# Patient Record
Sex: Female | Born: 1985 | Race: White | Hispanic: No | Marital: Married
Health system: Southern US, Community
[De-identification: ages and names within clinical notes are randomized; demographics above are authoritative.]

---

## 2017-03-26 ENCOUNTER — Other Ambulatory Visit: Payer: Self-pay | Admitting: Family Medicine

## 2017-03-26 ENCOUNTER — Ambulatory Visit
Admission: RE | Admit: 2017-03-26 | Discharge: 2017-03-26 | Disposition: A | Discharge status: Home / Self Care | Payer: Managed Care, Other (non HMO) | Source: Ambulatory Visit | Attending: Family Medicine | Admitting: Family Medicine

## 2017-03-26 DIAGNOSIS — T1490XA Injury, unspecified, initial encounter: Secondary | ICD-10-CM

## 2018-03-08 ENCOUNTER — Emergency Department (HOSPITAL_COMMUNITY): Payer: Managed Care, Other (non HMO)

## 2018-03-08 ENCOUNTER — Emergency Department (HOSPITAL_COMMUNITY)
Admission: EM | Admit: 2018-03-08 | Discharge: 2018-03-08 | Disposition: A | Discharge status: Home / Self Care | Payer: Managed Care, Other (non HMO) | Attending: Emergency Medicine | Admitting: Emergency Medicine

## 2018-03-08 ENCOUNTER — Encounter (HOSPITAL_COMMUNITY): Payer: Self-pay | Admitting: Emergency Medicine

## 2018-03-08 DIAGNOSIS — S99911A Unspecified injury of right ankle, initial encounter: Secondary | ICD-10-CM | POA: Diagnosis not present

## 2018-03-08 DIAGNOSIS — Y999 Unspecified external cause status: Secondary | ICD-10-CM | POA: Diagnosis not present

## 2018-03-08 DIAGNOSIS — W102XXA Fall (on)(from) incline, initial encounter: Secondary | ICD-10-CM | POA: Insufficient documentation

## 2018-03-08 DIAGNOSIS — Y9301 Activity, walking, marching and hiking: Secondary | ICD-10-CM | POA: Insufficient documentation

## 2018-03-08 DIAGNOSIS — Y929 Unspecified place or not applicable: Secondary | ICD-10-CM | POA: Insufficient documentation

## 2018-03-08 MED ORDER — IBUPROFEN 800 MG PO TABS
800.0000 mg | ORAL_TABLET | Freq: Once | ORAL | Status: AC
  Administered 2018-03-08: 800 mg via ORAL
  Filled 2018-03-08: qty 1

## 2018-03-08 NOTE — Discharge Instructions (Addendum)
Please read and follow all provided instructions.  You have been seen today for an injury to your Right ankle.   Tests performed today include: An x-ray of the affected area - does NOT show any broken bones or dislocations.  Vital signs. See below for your results today.   Home care instructions: -- *PRICE in the first 24-48 hours after injury: Protect (with brace, splint, sling), if given by your provider Rest Ice- Do not apply ice pack directly to your skin, place towel or similar between your skin and ice/ice pack. Apply ice for 20 min, then remove for 40 min while awake Compression- Wear brace, elastic bandage, splint as directed by your provider Elevate affected extremity above the level of your heart when not walking around for the first 24-48 hours   Use Ibuprofen (Motrin/Advil) 600mg  every 6 hours as needed for pain (do not exceed max dose in 24 hours, 2400mg )  Follow-up instructions: Please follow-up with your primary care provider or the provided orthopedic physician (bone specialist) if you continue to have significant pain in 1 week. In this case you may have a more severe injury that requires further care.   Return instructions:  Please return if your toes or feet are numb, appear gray or blue, or you have severe pain (also elevate the leg and loosen splint or wrap if you were given one) Please return to the Emergency Department if you experience worsening symptoms.  Please return if you have any other emergent concerns. Additional Information:  Your vital signs today were: BP 119/66 (BP Location: Right Arm)    Pulse 63    Temp 97.9 F (36.6 C)    Resp 16    LMP 02/15/2018    SpO2 100%  If your blood pressure (BP) was elevated above 135/85 this visit, please have this repeated by your doctor within one month. ---------------

## 2018-03-08 NOTE — ED Provider Notes (Signed)
[facility] EMERGENCY DEPARTMENT Provider Note   CSN: [id number] Arrival date & time: 03/08/18  1401     History   Chief Complaint Chief Complaint  Patient presents with  . Ankle Pain    HPI Alicia Ingram is a 32 y.o. female without significant past medical hx who presents to the ED with R ankle injury which occurred at 11:30 this AM. Patient states she was ambulating, missed a step, and rolled the R ankle falling to the ground. Describes inversion type injury. She did fall to the ground, no head injury, no LOC, no other areas of injury. She has been able to bear weight since the incident but this increases pain. She has applied ice. No other specific alleviating/aggravating factors. Pain severity is moderate. She has sprained this ankle in the past. Has not had prior surgery on the ankle. Denies numbness, weakness, paresthesias, or any other areas of injury.   HPI  History reviewed. No pertinent past medical history.  There are no active problems to display for this patient.   History reviewed. No pertinent surgical history.   OB History   None      Home Medications    Prior to Admission medications   Not on File    Family History History reviewed. No pertinent family history.  Social History Social History   Tobacco Use  . Smoking status: Not on file  Substance Use Topics  . Alcohol use: Not on file  . Drug use: Not on file     Allergies   Patient has no known allergies.   Review of Systems Review of Systems  Constitutional: Negative for chills and fever.  Musculoskeletal: Positive for arthralgias (R ankle pain).  Neurological: Negative for weakness and numbness.    Physical Exam Updated Vital Signs BP 119/66 (BP Location: Right Arm)   Pulse 63   Temp 97.9 F (36.6 C)   Resp 16   LMP 02/15/2018   SpO2 100%   Physical Exam  Constitutional: She appears well-developed and well-nourished. No distress.  HENT:  Head:  Normocephalic and atraumatic. Head is without raccoon's eyes and without Battle's sign.  Eyes: Conjunctivae are normal. Right eye exhibits no discharge. Left eye exhibits no discharge.  Neck: Normal range of motion. Neck supple. No spinous process tenderness present.  Cardiovascular: Normal rate and regular rhythm.  Pulses:      Dorsalis pedis pulses are 2+ on the right side, and 2+ on the left side.       Posterior tibial pulses are 2+ on the right side, and 2+ on the left side.  Pulmonary/Chest: Effort normal and breath sounds normal. No respiratory distress.  Abdominal: Soft. There is no tenderness.  Musculoskeletal:  Patient with mild diffuse swelling to the right ankle.  Otherwise no appreciable swelling, no obvious deformities, no erythema, no ecchymosis, no open wounds. Back: No midline tenderness. Upper extremities: Normal range of motion.  Nontender. Lower extremities: Patient has normal range of motion to bilateral hips, knees, and ankles.  She is somewhat tender to palpation over the lateral ankle ligaments as well as to superior to the medial malleolus on the R side.  No malleolar tenderness.  No tenderness at the base of the fifth.  No tenderness to the navicular bone.  No tenderness in the fibular head.  Lower extremities are otherwise nontender.  NVI distally.  Neurological: She is alert.  Clear speech.  Sensation grossly intact bilateral lower extremities.  Patient has 5 out of  5 strength with plantar dorsiflexion bilaterally.  She is able to ambulate somewhat antalgic gait.  Psychiatric: She has a normal mood and affect. Her behavior is normal. Thought content normal.  Nursing note and vitals reviewed.  ED Treatments / Results  Labs (all labs ordered are listed, but only abnormal results are displayed) Labs Reviewed - No data to display  EKG None  Radiology Dg Ankle Complete Right  Result Date: 03/08/2018 CLINICAL DATA:  Twisting injury right ankle today going down  stairs. Initial encounter. EXAM: RIGHT ANKLE - COMPLETE 3+ VIEW COMPARISON:  None. FINDINGS: There is no evidence of fracture, dislocation, or joint effusion. There is no evidence of arthropathy or other focal bone abnormality. Soft tissues are unremarkable. IMPRESSION: Normal exam. Electronically Signed   By: Drusilla Kannerhomas  Dalessio M.D.   On: 03/08/2018 14:30    Procedures Procedures (including critical care time)  Medications Ordered in ED Medications  ibuprofen (ADVIL,MOTRIN) tablet 800 mg (has no administration in time range)   Initial Impression / Assessment and Plan / ED Course  I have reviewed the triage vital signs and the nursing notes.  Pertinent labs & imaging results that were available during my care of the patient were reviewed by me and considered in my medical decision making (see chart for details).   Patient presents to the ED s/p mechanical fall with R ankle injury. Patient nontoxic appearing, in no apparent distress, initial vitals WNL. No evidence of serious head/neck/back injury from fall. R ankle X-ray negative for fracture/dislocation. Patient NVI distally. Will place in ASO brace, recommended ibuprofen for pain/swelling, PRICE, and orthopedics follow up. I discussed results, treatment plan, need for follow-up, and return precautions with the patient. Provided opportunity for questions, patient confirmed understanding and is in agreement with plan.   Final Clinical Impressions(s) / ED Diagnoses   Final diagnoses:  Injury of right ankle, initial encounter    ED Discharge Orders    None       Cherly Andersonetrucelli, [place] Sanon R, PA-C 03/08/18 1602    Doug SouJacubowitz, Sam, MD 03/08/18 [phone]

## 2018-03-08 NOTE — ED Triage Notes (Signed)
Patient reports she missed a step while walking down an incline and rolled her right ankle. Patient limping, declined wheelchair. Complains of pain in right ankle, no other complaints at this time.

## 2018-06-30 IMAGING — DX DG ANKLE COMPLETE 3+V*R*
3 series · 3 of 3 positions shown · non-contrast
Comparison: None.

CLINICAL DATA: Twisting injury right ankle today going down stairs.
Initial encounter.

EXAM:
RIGHT ANKLE - COMPLETE 3+ VIEW

[ankle ap]
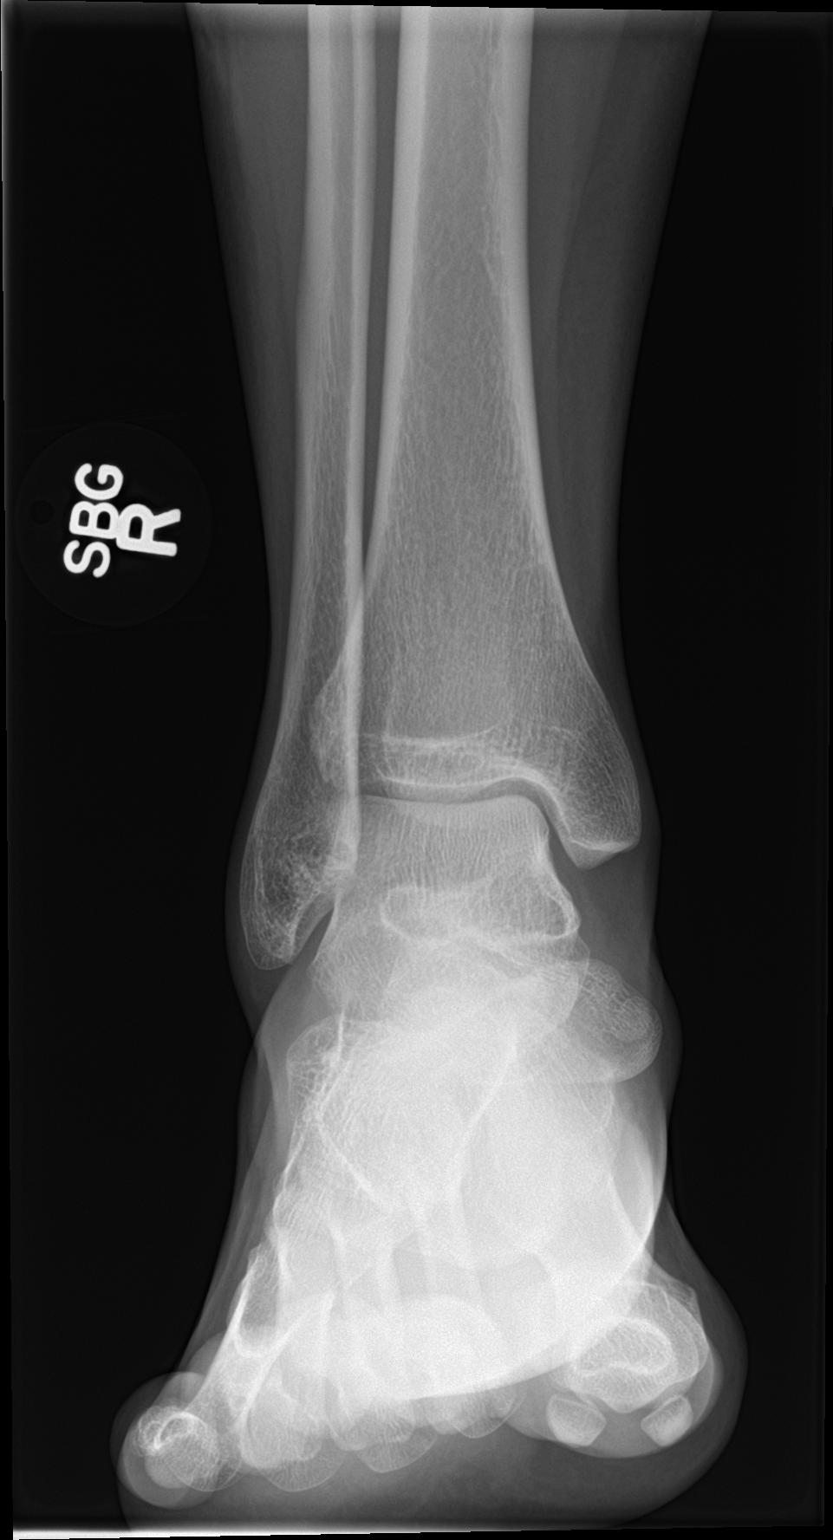

[ankle obl]
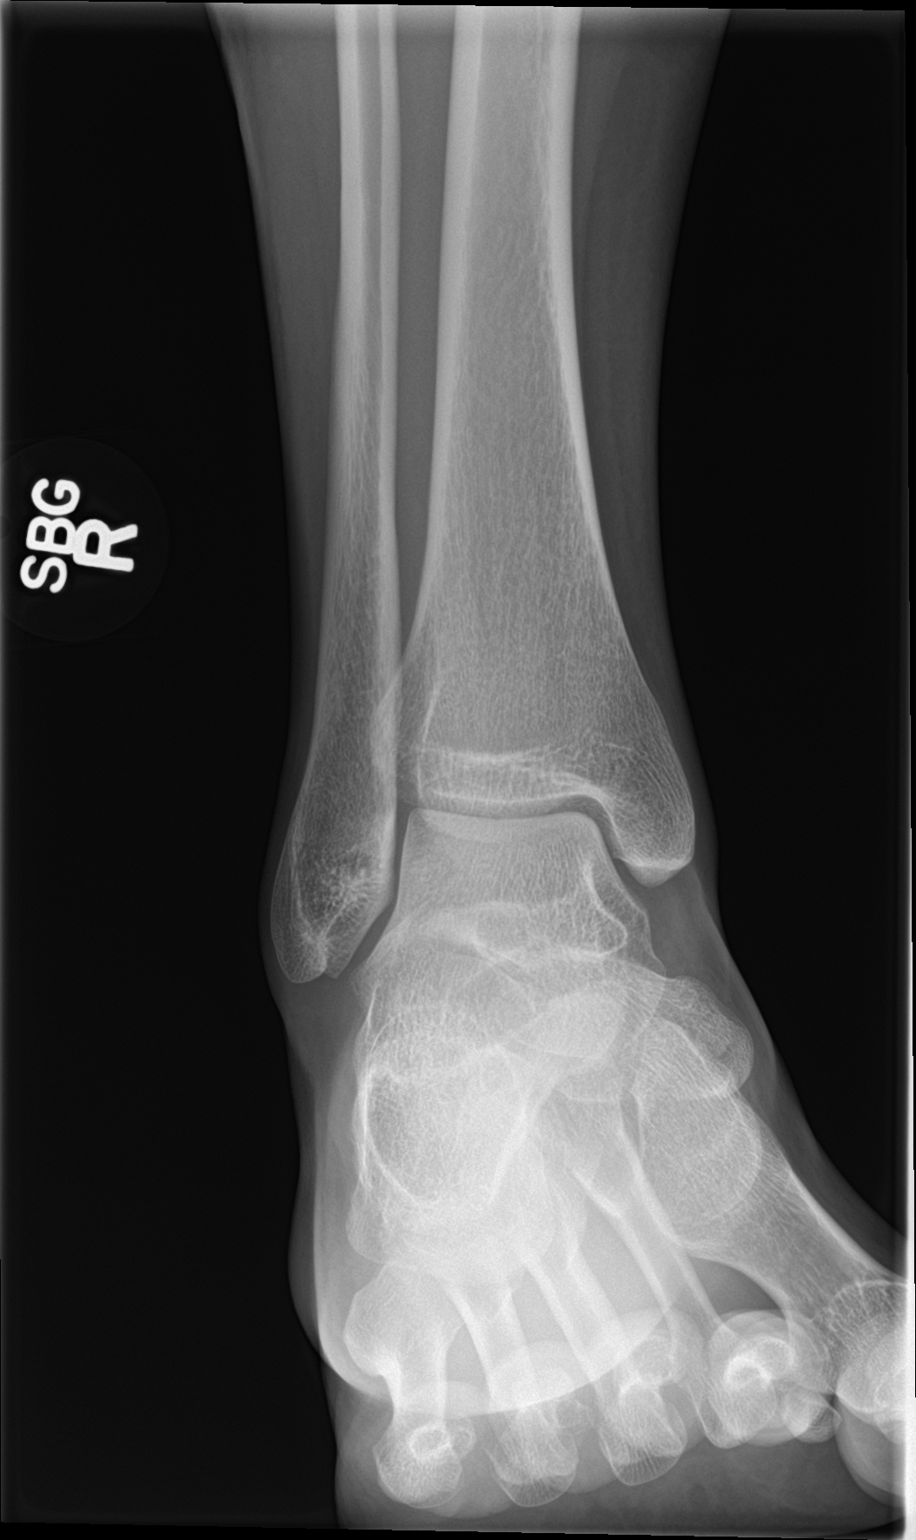

[ankle lat]
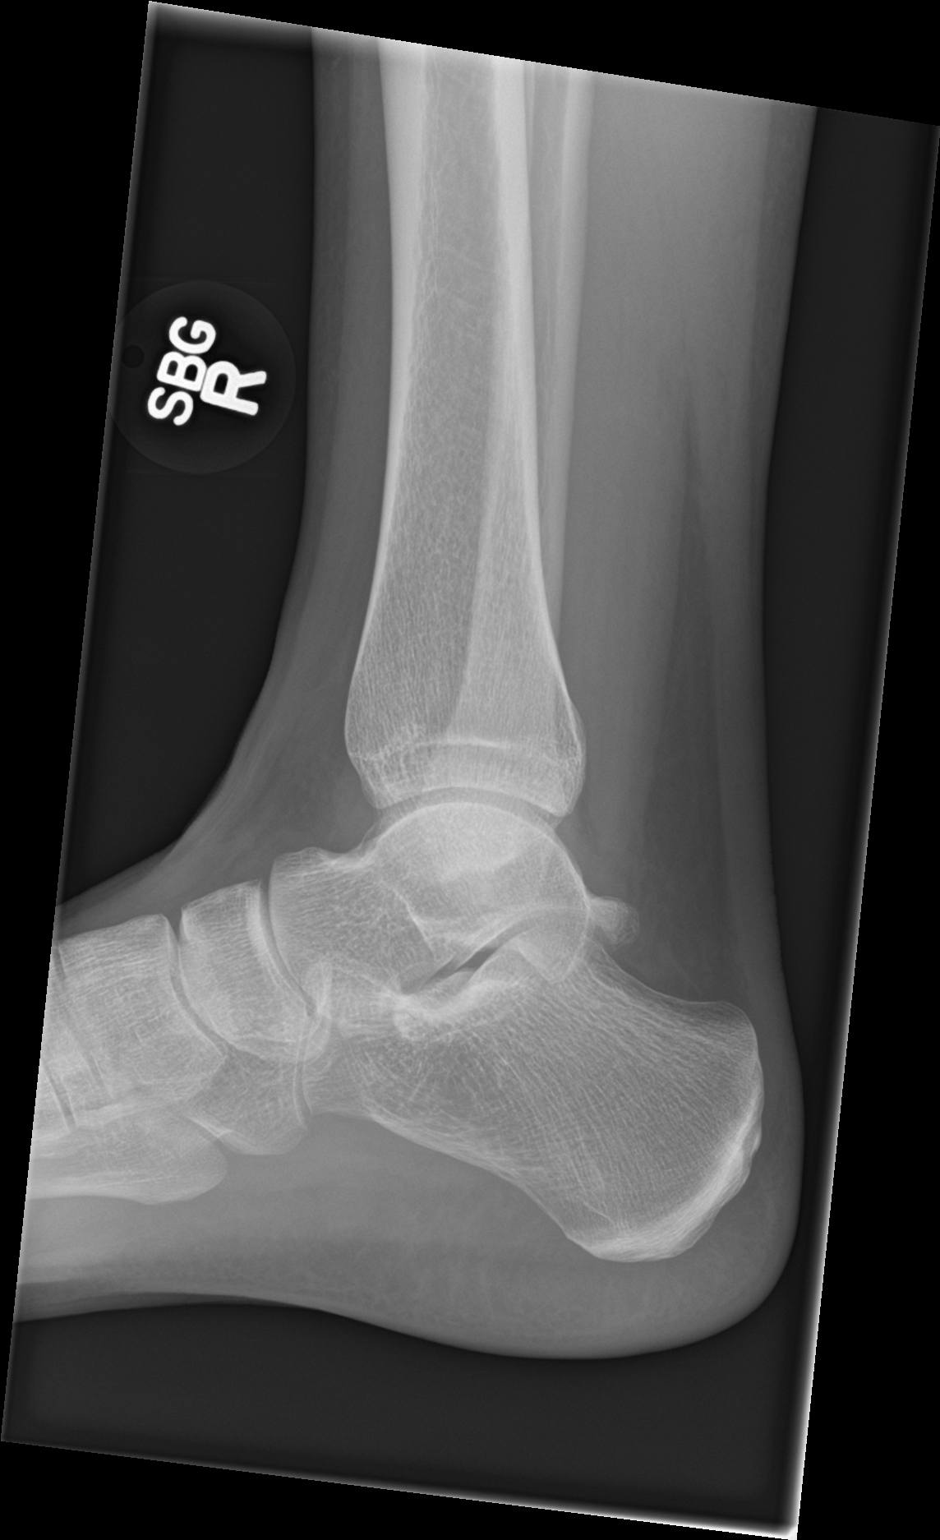

[3 of 3 positions shown; findings below may reference images not displayed]

FINDINGS: There is no evidence of fracture, dislocation, or joint effusion.
There is no evidence of arthropathy or other focal bone abnormality.
Soft tissues are unremarkable.
IMPRESSION: Normal exam.

## 2019-08-11 ENCOUNTER — Other Ambulatory Visit: Payer: Self-pay

## 2019-08-11 DIAGNOSIS — Z20822 Contact with and (suspected) exposure to covid-19: Secondary | ICD-10-CM

## 2019-08-12 LAB — NOVEL CORONAVIRUS, NAA: SARS-CoV-2, NAA: NOT DETECTED
